# Patient Record
Sex: Male | Born: 1940 | Race: Black or African American | Hispanic: No | Marital: Married | Smoking: Former smoker
Health system: Southern US, Community
[De-identification: ages and names within clinical notes are randomized; demographics above are authoritative.]

## PROBLEM LIST (undated history)

## (undated) DIAGNOSIS — C61 Malignant neoplasm of prostate: Principal | ICD-10-CM

## (undated) DIAGNOSIS — Z923 Personal history of irradiation: Secondary | ICD-10-CM

## (undated) HISTORY — DX: Personal history of irradiation: Z92.3

## (undated) HISTORY — PX: OTHER SURGICAL HISTORY: SHX169

## (undated) SURGERY — MINOR CAPSULOTOMY
Anesthesia: Choice | Laterality: Left

---

## 1998-04-05 ENCOUNTER — Encounter: Admission: RE | Admit: 1998-04-05 | Discharge: 1998-04-05 | Payer: Self-pay | Admitting: *Deleted

## 2005-07-21 ENCOUNTER — Ambulatory Visit (HOSPITAL_COMMUNITY): Admission: RE | Admit: 2005-07-21 | Discharge: 2005-07-21 | Payer: Self-pay | Admitting: Ophthalmology

## 2008-06-26 ENCOUNTER — Ambulatory Visit (HOSPITAL_COMMUNITY): Admission: RE | Admit: 2008-06-26 | Discharge: 2008-06-26 | Payer: Self-pay | Admitting: Ophthalmology

## 2009-08-11 IMAGING — CR DG CHEST 2V
2 series · 2 of 2 positions shown · non-contrast
Comparison: [DATE] S6.

CLINICAL DATA: Preoperative study for cataract surgery.

CHEST - 2 VIEW

[view not recorded (1 of 2)]
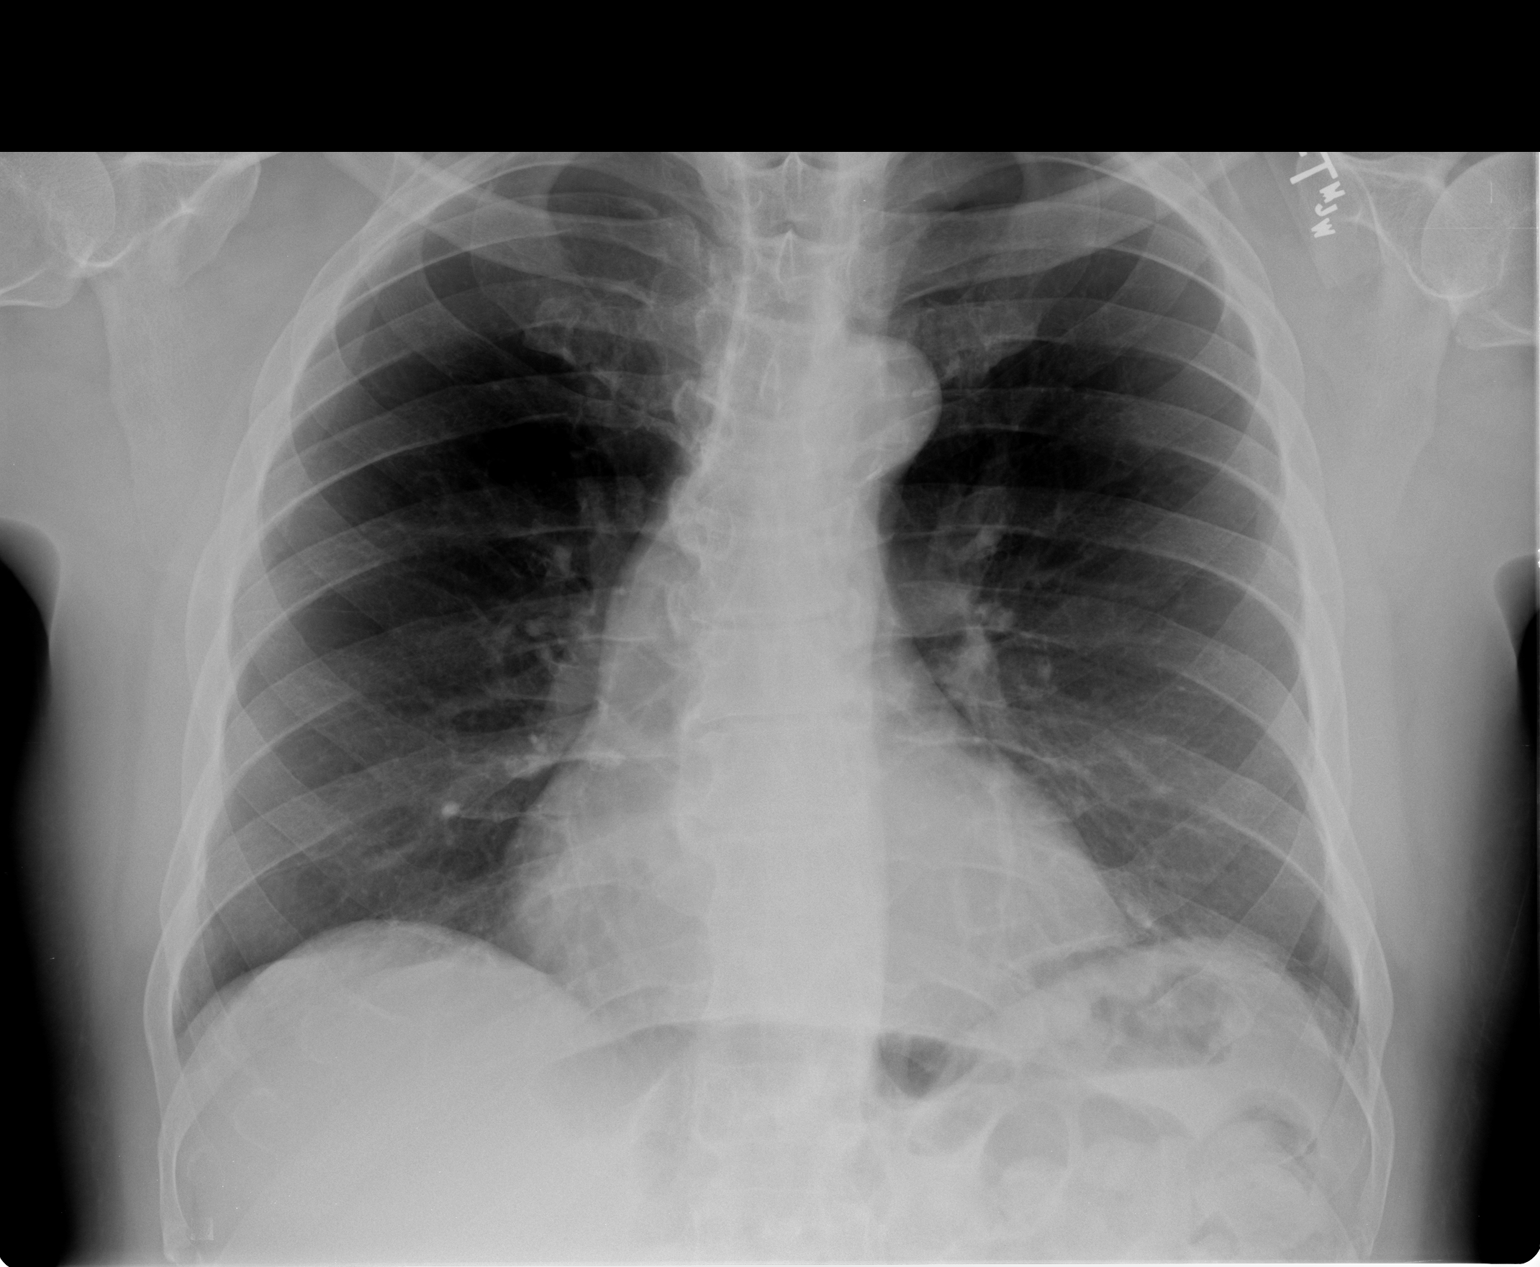

[view not recorded (2 of 2)]
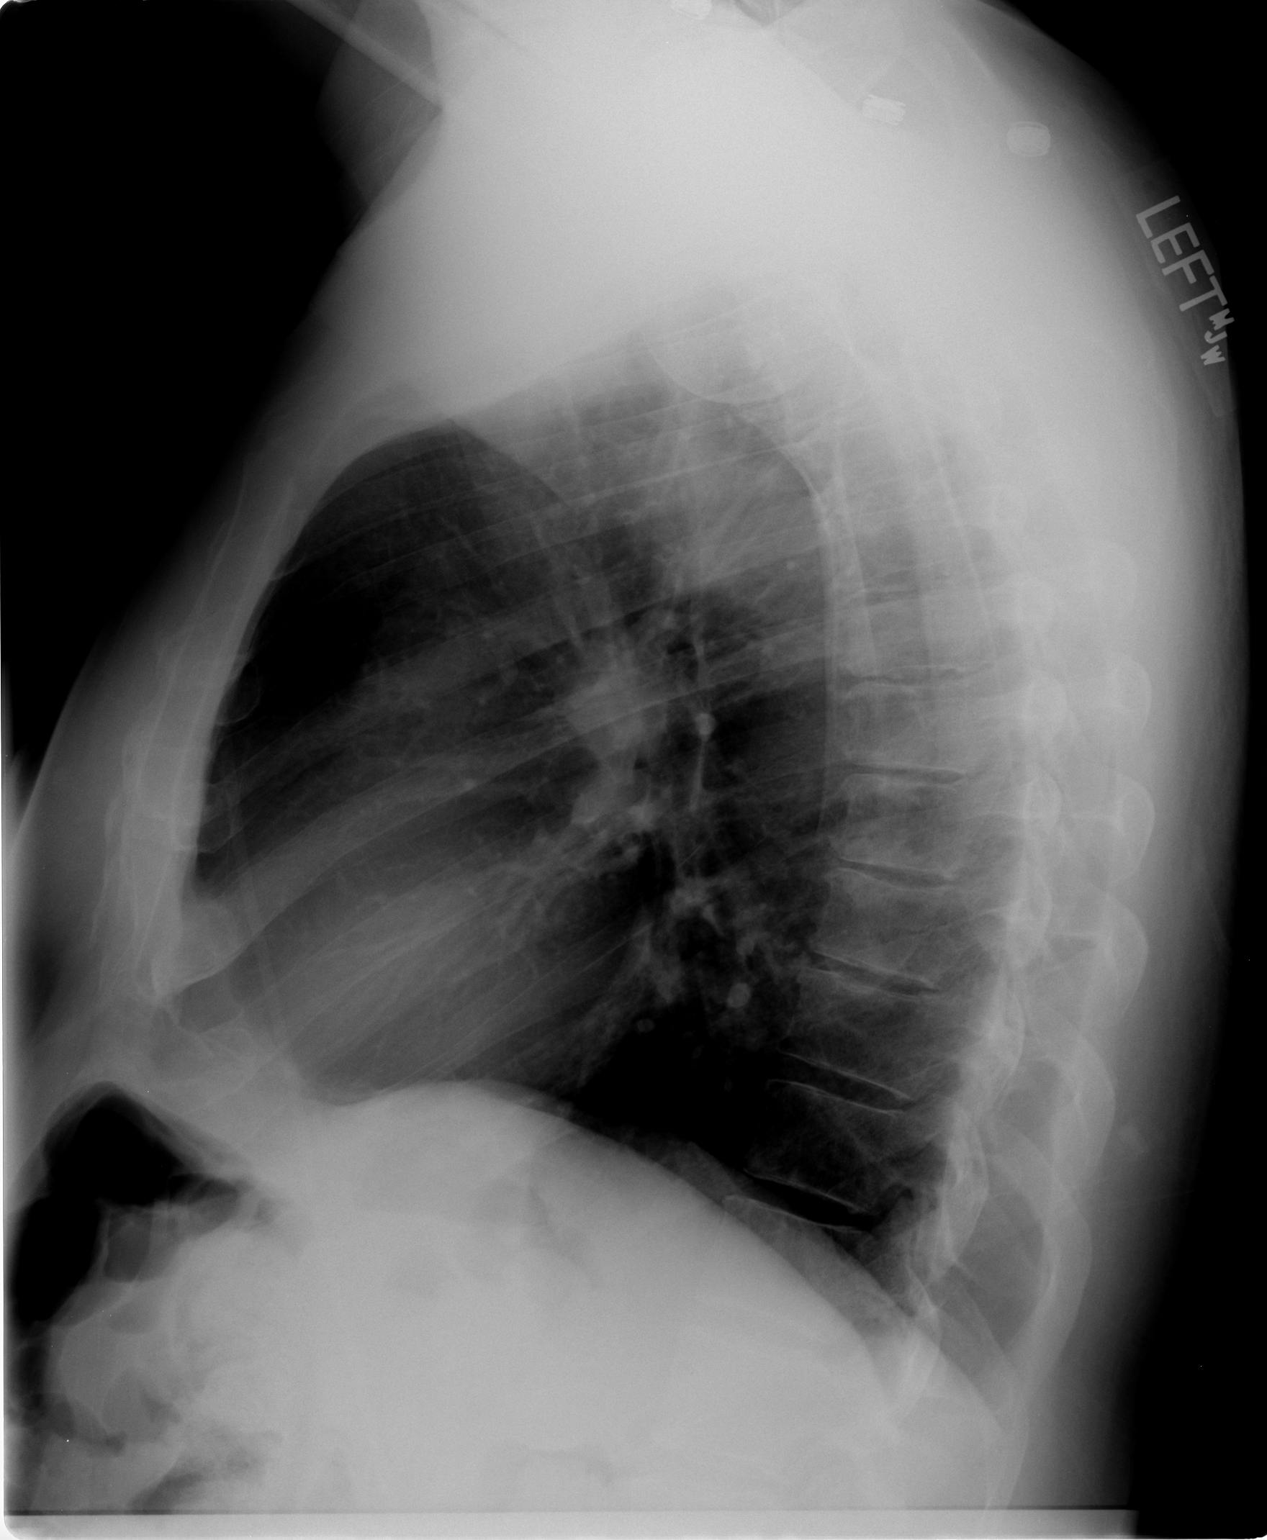

[2 of 2 positions shown; findings below may reference images not displayed]

FINDINGS: The heart size is within normal limits.  There is no
heart failure.  The lungs are clear without infiltrate or effusion.
IMPRESSION: No active cardiopulmonary disease.

## 2010-12-30 NOTE — Op Note (Signed)
Nicholas Conley, Nicholas Conley             ACCOUNT NO.:  [id number]   MEDICAL RECORD NO.:  [id number]          PATIENT TYPE:  AMB   LOCATION:  SDS                          FACILITY:  [facility]   PHYSICIAN:  Salley Scarlet., M.D.DATE OF BIRTH:  Oct 06, 1940   DATE OF PROCEDURE:  DATE OF DISCHARGE:  06/26/2008                               OPERATIVE REPORT   PREOPERATIVE DIAGNOSIS:  Immature cataract, left eye.   POSTOPERATIVE DIAGNOSIS:  Immature cataract, left eye.   OPERATION:  Kelman phacoemulsification of cataract, left eye.   ANESTHESIA:  Local, using Xylocaine 2% and Marcaine 0.75% with Wydase.   JUSTIFICATION FOR PROCEDURE:  This is a 70 year old gentleman who  underwent a cataract extraction from the right eye approximately 2 years  ago.  He obtained good visual result, but now complains of blurring of  vision with difficulty seeing to read.  He was evaluated and found to  have a visual acuity best corrected to 20/25 on the right, 20/60 on the  left.  There was an intraocular lens present and centered nicely in the  right eye, but there was a posterior 2+ nuclear sclerotic cataract along  with the posterior capsular changes of the left eye.  Cataract  extraction was recommended and he was admitted at this time for that  purpose.   PROCEDURE:  Under the influence of IV sedation, a Van Lint akinesia and  retrobulbar anesthesia was given.  The patient was prepped and draped in  usual manner.  The lid speculum was inserted under the upper and lower  lid of the left eye, and a 4-0 silk traction suture was passed through  the belly of the superior rectus muscle retraction.  A fornix-based  conjunctival flap was turned and hemostasis was achieved using cautery.  An incision was made in the sclera at the limbus.  This incision was  dissected down to clear cornea using a crescent blade.  A sideport  incision was made at 1:30 o'clock position.  OcuCoat was injected into  the eye through  the sideport incision.  The anterior chamber was then  entered through the corneoscleral tunnel incision at 11:30 o'clock  position using a 2.75 mm keratome.  An anterior capsulotomy was  performed using a bent 25-gauge needle.  The nucleus was hydrodissected  using Xylocaine.  The KPE handpiece was passed into the eye, and the  nucleus was emulsified without difficulty.  The residual cortical  material was aspirated.  The posterior capsule was polished using an  olive tip polisher.  The wound was widened slightly to accommodate  foldable silicone lens.  The lens was seated into the eye behind the  iris without difficulty.  The anterior chamber was reformed and pupil  was constricted using Miochol.  The lips of the wound hydrated and  tested to make sure that there was no leak.  After ascertaining that  there was no leak, the conjunctiva was closed over the wound using  thermal cautery.  Celestone 1 mL and 0.5 mL of gentamicin were injected  subconjunctivally.  Maxitrol ophthalmic ointment and Pilopine ointment  were applied along  with a patch and Fox shield.  The patient tolerated  the procedure well and was discharged to the post anesthesia recovery  room in  satisfactory condition.  He is instructed to rest today, to take Tylenol  every 4 hours as needed for pain, and to see me in office tomorrow for  further evaluation.   DISCHARGE DIAGNOSIS:  Immature cataract, left eye.      Salley Scarlet., M.D.  Electronically Signed     TB/MEDQ  D:  06/27/2008  T:  06/27/2008  Job:  [id number]

## 2010-12-30 NOTE — Op Note (Signed)
Nicholas Conley, Nicholas Conley             ACCOUNT NO.:  [id number]   MEDICAL RECORD NO.:  [id number]          PATIENT TYPE:  AMB   LOCATION:  SDS                          FACILITY:  [facility]   PHYSICIAN:  Salley Scarlet., M.D.DATE OF BIRTH:  02-04-1941   DATE OF PROCEDURE:  09/21/2005  DATE OF DISCHARGE:  07/21/2005                                 OPERATIVE REPORT   PREOPERATIVE DIAGNOSIS:  Mature cataract, right eye.   POSTOPERATIVE DIAGNOSIS:  Mature cataract, right eye.   OPERATION PERFORMED:  Extracapsular cataract extraction with intraocular  lens implantation.   SURGEON:  Nadyne Coombes, M.D.   ANESTHESIA:  Local using Xylocaine 2% and Marcaine 0.75% and Wydase.   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  He presented  to me for evaluation and was found to have a dense white cataract of the  right eye with visual acuity best corrected to hand motion in that eye.  Vision was corrected to 20/30 in the left eye.  Cataract extraction of the  right eye was recommended with intraocular lens implantation.  He was  admitted at this time for that purpose.   DESCRIPTION OF PROCEDURE:  Under the influence of intravenous sedation and  Van Lint akinesia, retrobulbar anesthesia was given.  The patient was  prepped and draped in the usual manner.  A lid speculum was inserted under  the upper and lower lid of the right eye and a 4-0 silk traction suture was  passed through the belly of the superior rectus muscle for traction.  A  fornix-based conjunctival flap was turned and hemostasis achieved using  cautery.  A grooved incision was made around the limbus for 120 degrees.  The anterior chamber was entered through this grooved incision at the 11:30  o'clock position.  Ocucoat was injected into the eye through this incision.  An anterior capsulotomy was done using a bent 25 gauge needle.  The  corneoscleral wound was extended first toward the left then toward the right  placing a single 8-0 Vicryl suture across each arm of the incision as it was  respectively first toward the left then toward the right.  The nucleus was  then manually expressed from the eye.  The two previously placed sutures  were closed and a third one was placed at the 12 o'clock position.  The  irrigation aspiration handpiece was passed into the eye and the residual  cortical material was aspirated.  The posterior capsule was polished with  the olive tip polisher.  An rigid PMMA posterior chamber lens was seated  into the eye behind the iris without difficulty.  The anterior chamber was  reformed and the pupil was constricted using Miochol.  The corneoscleral  wound was closed using a combination of interrupted sutures in 10-0 nylon  sutures.  After it was ascertained that the wound was airtight and  watertight, the conjunctiva was closed using thermal cautery.  1mL of  Celestone and 0.16mL of gentamicin were injected subconjunctivally.  Maxitrol  ophthalmic  ointment and Pilopine drops were applied along with a patch and  Fox shield. The patient tolerated the procedure well and was discharged to  the post anesthesia  recovery room in satisfactory condition.  The patient was instructed to rest  today, to take Vicodin every four hours as needed for pain and to see me in  the office tomorrow for further evaluation.   DISCHARGE DIAGNOSIS:  Immature cataract, right eye.      Salley Scarlet., M.D.  Electronically Signed     TB/MEDQ  D:  09/21/2005  T:  09/22/2005  Job:  [id number]

## 2011-05-16 LAB — BASIC METABOLIC PANEL
BUN: 29 — ABNORMAL HIGH
CO2: 31
Calcium: 9.9
Creatinine, Ser: 1.52 — ABNORMAL HIGH
GFR calc Af Amer: 56 — ABNORMAL LOW

## 2011-05-16 LAB — URINALYSIS, ROUTINE W REFLEX MICROSCOPIC
Glucose, UA: NEGATIVE
Ketones, ur: NEGATIVE
Nitrite: NEGATIVE
Protein, ur: NEGATIVE
Urobilinogen, UA: 0.2

## 2011-05-16 LAB — GLUCOSE, CAPILLARY: Glucose-Capillary: 56 — ABNORMAL LOW

## 2011-05-16 LAB — CBC
MCHC: 33.7
Platelets: 330
RBC: 4.15 — ABNORMAL LOW
WBC: 4.7

## 2013-07-04 DIAGNOSIS — C61 Malignant neoplasm of prostate: Secondary | ICD-10-CM

## 2013-07-04 HISTORY — DX: Malignant neoplasm of prostate: C61

## 2013-07-30 ENCOUNTER — Ambulatory Visit: Payer: Medicare Other

## 2013-07-30 ENCOUNTER — Ambulatory Visit: Payer: Medicare Other | Admitting: Radiation Oncology

## 2013-08-18 ENCOUNTER — Ambulatory Visit: Payer: Medicare Other | Admitting: Radiation Oncology

## 2013-08-18 ENCOUNTER — Ambulatory Visit: Payer: Medicare Other

## 2013-08-22 ENCOUNTER — Encounter: Payer: Self-pay | Admitting: Radiation Oncology

## 2013-08-22 NOTE — Progress Notes (Signed)
GU Location of Tumor / Histology: prostate  If Prostate Cancer, 07/07/13 Biopsy= Adenocarcinaom Gleason Score is (4 + 3=7 and (3+4=7))  And (3+3=6)and PSA is 9.75 on 05/21/13, 8.59 on 07/30/12 prostate volume=71cc  Patient presented  months ago with signs/symptoms of: elevated PSA's, frequency,urgency and occasional incontinence  Biopsies of prostate (if applicable) revealed:  2/26 cores adenocarcinoma  Past/Anticipated interventions by urology, if any: last  appt with  Dr.Nesi,Marc12/12/14,   Past/Anticipated interventions by medical oncology, if any : No  Weight changes, if any: no  Bowel/Bladder complaints, if any: BPH/LUTS  Nausea/Vomiting, if any: no  Pain issues, if any:  no  SAFETY ISSUES:  Prior radiation? No  Pacemaker/ICD? NO  Is the patient on methotrexate? No  Current Complaints / other details: Physician,  Married , 2 children,fathere deceased pneumonia, mother deceased age 31 stroke, smoked 1/2ppd 20 years ,quit 25 years ago

## 2013-08-28 ENCOUNTER — Encounter: Payer: Self-pay | Admitting: Radiation Oncology

## 2013-08-28 ENCOUNTER — Ambulatory Visit
Admission: RE | Admit: 2013-08-28 | Discharge: 2013-08-28 | Disposition: A | Discharge status: Home / Self Care | Payer: Medicare Other | Source: Ambulatory Visit | Attending: Radiation Oncology | Admitting: Radiation Oncology

## 2013-08-28 VITALS — BP 131/78 | HR 101 | Temp 97.8°F | Resp 20 | Ht 74.0 in | Wt 200.1 lb

## 2013-08-28 DIAGNOSIS — Z7982 Long term (current) use of aspirin: Secondary | ICD-10-CM | POA: Insufficient documentation

## 2013-08-28 DIAGNOSIS — N529 Male erectile dysfunction, unspecified: Secondary | ICD-10-CM | POA: Insufficient documentation

## 2013-08-28 DIAGNOSIS — Z79899 Other long term (current) drug therapy: Secondary | ICD-10-CM | POA: Insufficient documentation

## 2013-08-28 DIAGNOSIS — N400 Enlarged prostate without lower urinary tract symptoms: Secondary | ICD-10-CM | POA: Insufficient documentation

## 2013-08-28 DIAGNOSIS — C61 Malignant neoplasm of prostate: Secondary | ICD-10-CM | POA: Insufficient documentation

## 2013-08-28 DIAGNOSIS — Z87891 Personal history of nicotine dependence: Secondary | ICD-10-CM | POA: Insufficient documentation

## 2013-08-28 HISTORY — DX: Malignant neoplasm of prostate: C61

## 2013-08-28 NOTE — Progress Notes (Signed)
[facility] NEW PATIENT EVALUATION  Name: Nicholas Conley MRN: [id number]  Date:   08/28/2013           DOB: 1941-03-25  Status: outpatient   CC: No primary provider on file.  Lowella Bandy, MD    REFERRING PHYSICIAN: Lowella Bandy, MD   DIAGNOSIS: Stage TI C. intermediate risk adenocarcinoma prostate   HISTORY OF PRESENT ILLNESS:  Nicholas Conley is a 73 y.o. male who is seen today through the courtesy Dr. Janice Norrie for discussion of possible radiation therapy in the management of his stage TI C. intermediate risk adenocarcinoma prostate. His PSA was 6.2 in May 2013, and rose to 8.0 by late 2013. A followup PSA on 07/30/2012 was 8.59 with a percentage free of 30. Followup PSA on 05/21/2013 was 9.75 with a percentage free of 22%. He underwent ultrasound-guided biopsies on 07/04/2013 and was found to have Gleason 7 (3+4) involving 20% of one core from left base, 40% of one core from the left lateral mid gland and also Gleason 7 (4+3) involving 30% of one core from the left mid gland. He was found to have Gleason 6 (3+3) involving less than 5% of one core from right lateral apex, 5% of one core from the left lateral base, and 20% of one core from the left apex. 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 His prostate volume was 71 cc. He comes in today interested in seed implantation. He is doing  well from a GU and GI standpoint. His I PSS score is 8. He does have erectile dysfunction.  PREVIOUS RADIATION THERAPY: No   PAST MEDICAL HISTORY:  has a past medical history of Prostate cancer (07/04/13).     PAST SURGICAL HISTORY:  Past Surgical History  Procedure Laterality Date  . Cataract surgery      bilaterally     FAMILY HISTORY: His father died of pneumonia 33, and his mother died following a stroke at 62. No family history of prostate cancer. Married, 2 children. 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  SOCIAL  HISTORY:  reports that he has quit smoking. His smoking use included Cigarettes. He has a 10 pack-year smoking history. He has never used smokeless tobacco.   ALLERGIES: Review of patient's allergies indicates not on file.   MEDICATIONS:  Current Outpatient Prescriptions  Medication Sig Dispense Refill  . aspirin 81 MG tablet Take 81 mg by mouth daily.      Marland Kitchen Dextromethorphan-Guaifenesin (CORICIDIN HBP CONGESTION/COUGH PO) Take 1 tablet by mouth 2 (two) times daily.      Marland Kitchen glyBURIDE micronized (GLYNASE) 6 MG tablet Take 6 mg by mouth 2 (two) times daily before a meal.      . Menthol (COUGH DROPS) 5 MG LOZG Use as directed 1 lozenge in the mouth or throat daily as needed. Usually takes throughout the day to suppres cough      . metFORMIN (GLUCOPHAGE) 1000 MG tablet Take 1,000 mg by mouth 2 (two) times daily with a meal.      . quinapril (ACCUPRIL) 40 MG tablet Take 40 mg by mouth.      . simvastatin (ZOCOR) 40 MG tablet Take 40 mg by mouth daily.      . tadalafil (CIALIS) 5 MG tablet Take 5 mg by mouth daily as needed for erectile dysfunction.       No current facility-administered medications for this encounter.     REVIEW  OF SYSTEMS:  Pertinent items are noted in HPI.    PHYSICAL EXAM:  height is 6\' 2"  (1.88 m) and weight is 200 lb 1.6 oz (90.765 kg). His oral temperature is 97.8 F (36.6 C). His blood pressure is 131/78 and his pulse is 101. His respiration is 20.   Alert and oriented 73 year old African American male appearing younger than stated age. Head and neck examination: Grossly unremarkable. Nodes: Without palpable cervical or supraclavicular adenopathy. Chest: Lungs clear. Abdomen: Without masses organomegaly. Genitalia: Unremarkable to inspection. Rectal: His prostate gland is moderately enlarged and is without focal induration or nodularity. Extremities: Without edema.   LABORATORY DATA:  Lab Results  Component Value Date   WBC 4.7 06/26/2008   HGB 12.4* 06/26/2008    HCT 36.8* 06/26/2008   MCV 88.7 06/26/2008   PLT 330 06/26/2008   Lab Results  Component Value Date   NA 143 06/26/2008   K 4.4 06/26/2008   CL 105 06/26/2008   CO2 31 06/26/2008   No results found for this basename: ALT, AST, GGT, ALKPHOS, BILITOT   PSA 9.75 from 05/21/2013   IMPRESSION: Stage TI C. intermediate risk adenocarcinoma prostate. I explained to Dr. [name] that his prognosis is related to his stage, Gleason score, and PSA level. His stage and PSA level are favorable while his Gleason score of 7 is of intermediate favorability. Other prognostic factors include PSA doubling time and disease volume. His disease volume his intermediate. We discussed surgery versus observation/surveillance versus radiation therapy. Radiation therapy options include seed implantation with or without 5 weeks of external beam radiation therapy or 8 weeks of external beam/IMRT. Considering his disease volume and have a Gleason 7 (4+3) I do not recommend seed implantation alone. He would require 5 weeks of 3-D conformal radiation therapy in addition to seed implantation. It is possible that he may need to be downsized for 3 months to be a candidate for seed implantation. We also discussed short-term androgen deprivation therapy considering his intermediate risk disease, and this is currently being studied in a national trial. After lengthy discussion he is now leaning towards external beam/IMRT. We discussed the potential acute and late toxicities of radiation therapy. I told that this could be given at [facility], closer to home with Dr. Charlotta Newton, an experienced prostate radiation oncologist. He tells me he prefers to have his treatment geographically away from his work, and would like to have treatment here at [facility] in [place]. Consent is signed today. I will ask Dr. Janice Norrie to placed 3 gold seed markers within the prostate for image guidance during his IMRT. We'll then get him scheduled for  simulation/treatment planning.   PLAN: As discussed above.  I spent 60 minutes minutes face to face with the patient and more than 50% of that time was spent in counseling and/or coordination of care.

## 2013-08-29 ENCOUNTER — Telehealth: Payer: Self-pay | Admitting: *Deleted

## 2013-08-29 NOTE — Telephone Encounter (Signed)
CALLED PATIENT TO INFORM OF GOLD SEED PLACEMENT ON 10-01-13 - ARRIVAL TIME - 8:45 AM AT DR. Janice Norrie' AND HIS SIM ON 09-30-13 AT 9:00 AM AT DR. MURRAY'S OFFICE, LVM FOR A RETURN CALL

## 2013-09-02 ENCOUNTER — Telehealth: Payer: Self-pay | Admitting: *Deleted

## 2013-09-02 NOTE — Telephone Encounter (Signed)
Called patient to inform of gold seed placement on 09-25-13 at the urologist's office and his sim on 09-30-13 at 9:00 am at Dr. Charlton Amor Office, spoke with patient and he is aware of these appts.

## 2013-09-29 ENCOUNTER — Telehealth: Payer: Self-pay | Admitting: *Deleted

## 2013-09-29 NOTE — Telephone Encounter (Signed)
Patient's seeds was placed on 09-25-13 @ Dr. Janice Norrie' Office, confirmed this info. With [facility].

## 2013-09-30 ENCOUNTER — Ambulatory Visit
Admission: RE | Admit: 2013-09-30 | Discharge: 2013-09-30 | Disposition: A | Discharge status: Home / Self Care | Payer: Medicare Other | Source: Ambulatory Visit | Attending: Radiation Oncology | Admitting: Radiation Oncology

## 2013-09-30 DIAGNOSIS — R3915 Urgency of urination: Secondary | ICD-10-CM | POA: Insufficient documentation

## 2013-09-30 DIAGNOSIS — C61 Malignant neoplasm of prostate: Secondary | ICD-10-CM | POA: Insufficient documentation

## 2013-09-30 DIAGNOSIS — R32 Unspecified urinary incontinence: Secondary | ICD-10-CM | POA: Insufficient documentation

## 2013-09-30 DIAGNOSIS — Z510 Encounter for antineoplastic radiation therapy: Secondary | ICD-10-CM | POA: Insufficient documentation

## 2013-09-30 NOTE — Progress Notes (Signed)
Complex simulation/treatment planning note: The patient was taken to the CT simulator. He was placed supine. A custom vac lock immobilization device was constructed. A rubber tube was placed within the rectal vault. He was then catheterized and contrast instilled into the bladder/urethra. The CT data set was sent to the treatment planning system for contouring of his prostate, seminal vesicles, rectum, bladder, and rectosigmoid colon. I'm prescribing 7800 cGy to his prostate PTV which represents the prostate was 0.8 cm except for 0.5 cm along the rectum. I prescribing 5600 cGy in 40 sessions to his seminal vesicle PTV which represents the seminal vesicles posterior 0.5 cm. He'll be treated with a comfortably full bladder and he'll undergo daily cone beam CT image guidance setting up to his 3 gold seeds. He is now ready for IMRT simulation/treatment planning.

## 2013-10-02 ENCOUNTER — Encounter: Payer: Self-pay | Admitting: Radiation Oncology

## 2013-10-02 NOTE — Progress Notes (Signed)
IMRT simulation/treatment planning note: The patient completed his IMRT simulation/treatment planning in the management of his carcinoma the prostate. IMRT was chosen to decrease the risk for both acute and late bladder and rectal toxicity compared to conventional or 3-D conformal radiation therapy. Dose volume histograms were obtained for the target structures including the prostate and seminal vesicles and also avoidance structures including the bladder, rectum, and femoral heads. We met our departmental goals. He is being treated with 2 arcs VMAT was 6 MV photons. I prescribing 7800 cGy to the prostate PTV and 5600 cGy to his seminal vesicle PTV in 40 sessions. I requesting daily image guidance with cone beam CT setting up to his 3 gold seeds. I am also requesting that he be treated with a company full bladder.

## 2013-10-09 ENCOUNTER — Encounter: Payer: Self-pay | Admitting: Radiation Oncology

## 2013-10-09 ENCOUNTER — Ambulatory Visit
Admission: RE | Admit: 2013-10-09 | Discharge: 2013-10-09 | Disposition: A | Discharge status: Home / Self Care | Payer: Medicare Other | Source: Ambulatory Visit | Attending: Radiation Oncology | Admitting: Radiation Oncology

## 2013-10-09 DIAGNOSIS — C61 Malignant neoplasm of prostate: Secondary | ICD-10-CM

## 2013-10-09 NOTE — Progress Notes (Signed)
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 He was treated with dual ARC VMAT with dynamic MLCs corresponding to one set of IMRT treatment devices [phone])

## 2013-10-09 NOTE — Progress Notes (Signed)
Weekly Management Note:  Site: Prostate Current Dose:  195  cGy Projected Dose: 7800  cGy  Narrative: The patient is seen today for routine under treatment assessment. CBCT/MVCT images/port films were reviewed. The chart was reviewed.   Bladder filling satisfactory. No new GU or GI difficulties.  Physical Examination: There were no vitals filed for this visit..  Weight:  . No change.  Impression: Tolerating radiation therapy well.  Plan: Continue radiation therapy as planned.

## 2013-10-09 NOTE — Progress Notes (Signed)
Post sim ed completed w/pt. Gave pt "Radiation and You" booklet w/all pertinent information marked and discussed, re: fatigue, rectal discomfort/care, urinary issues/management, nutrition, pain. Pt verbalized understanding. Pt had questions re: intercourse/condom use, weekly labs. Dr Valere Dross in with pt to answer questions.

## 2013-10-10 ENCOUNTER — Ambulatory Visit
Admission: RE | Admit: 2013-10-10 | Discharge: 2013-10-10 | Disposition: A | Discharge status: Home / Self Care | Payer: Medicare Other | Source: Ambulatory Visit | Attending: Radiation Oncology | Admitting: Radiation Oncology

## 2013-10-13 ENCOUNTER — Ambulatory Visit
Admission: RE | Admit: 2013-10-13 | Discharge: 2013-10-13 | Disposition: A | Discharge status: Home / Self Care | Payer: Medicare Other | Source: Ambulatory Visit | Attending: Radiation Oncology | Admitting: Radiation Oncology

## 2013-10-13 ENCOUNTER — Encounter: Payer: Self-pay | Admitting: Radiation Oncology

## 2013-10-13 VITALS — BP 153/79 | HR 102 | Temp 98.3°F | Resp 20 | Wt 198.8 lb

## 2013-10-13 DIAGNOSIS — C61 Malignant neoplasm of prostate: Secondary | ICD-10-CM

## 2013-10-13 NOTE — Progress Notes (Signed)
Pt denies pain, urinary/bowel issues, fatigue, loss of appetite. 

## 2013-10-13 NOTE — Progress Notes (Signed)
Weekly Management Note:  Site: Prostate Current Dose:  585  cGy Projected Dose: 7800  cGy  Narrative: The patient is seen today for routine under treatment assessment. CBCT/MVCT images/port films were reviewed. The chart was reviewed.   Bladder filling is satisfactory, but could improve. No GU or GI difficulty. Physical Examination:  Filed Vitals:   10/13/13 0841  BP: 153/79  Pulse: 102  Temp: 98.3 F (36.8 C)  Resp: 20  .  Weight: 198 lb 12.8 oz (90.175 kg). No change.  Impression: Tolerating radiation therapy well.  Plan: Continue radiation therapy as planned.

## 2013-10-14 ENCOUNTER — Ambulatory Visit
Admission: RE | Admit: 2013-10-14 | Discharge: 2013-10-14 | Disposition: A | Discharge status: Home / Self Care | Payer: Medicare Other | Source: Ambulatory Visit | Attending: Radiation Oncology | Admitting: Radiation Oncology

## 2013-10-15 ENCOUNTER — Ambulatory Visit
Admission: RE | Admit: 2013-10-15 | Discharge: 2013-10-15 | Disposition: A | Discharge status: Home / Self Care | Payer: Medicare Other | Source: Ambulatory Visit | Attending: Radiation Oncology | Admitting: Radiation Oncology

## 2013-10-16 ENCOUNTER — Ambulatory Visit
Admission: RE | Admit: 2013-10-16 | Discharge: 2013-10-16 | Disposition: A | Discharge status: Home / Self Care | Payer: Medicare Other | Source: Ambulatory Visit | Attending: Radiation Oncology | Admitting: Radiation Oncology

## 2013-10-17 ENCOUNTER — Ambulatory Visit
Admission: RE | Admit: 2013-10-17 | Discharge: 2013-10-17 | Disposition: A | Discharge status: Home / Self Care | Payer: Medicare Other | Source: Ambulatory Visit | Attending: Radiation Oncology | Admitting: Radiation Oncology

## 2013-10-20 ENCOUNTER — Ambulatory Visit
Admission: RE | Admit: 2013-10-20 | Discharge: 2013-10-20 | Disposition: A | Discharge status: Home / Self Care | Payer: Medicare Other | Source: Ambulatory Visit | Attending: Radiation Oncology | Admitting: Radiation Oncology

## 2013-10-20 ENCOUNTER — Encounter: Payer: Self-pay | Admitting: Radiation Oncology

## 2013-10-20 VITALS — BP 132/76 | HR 90 | Temp 97.9°F | Resp 20 | Wt 204.4 lb

## 2013-10-20 DIAGNOSIS — C61 Malignant neoplasm of prostate: Secondary | ICD-10-CM

## 2013-10-20 NOTE — Progress Notes (Signed)
Weekly Management Note:  Site: Prostate Current Dose:  1560  cGy Projected Dose: 7800  cGy  Narrative: The patient is seen today for routine under treatment assessment. CBCT/MVCT images/port films were reviewed. The chart was reviewed.   Bladder filling is satisfactory. No GU or GI difficulty.  Physical Examination:  Filed Vitals:   10/20/13 0836  BP: 132/76  Pulse: 90  Temp: 97.9 F (36.6 C)  Resp: 20  .  Weight: 204 lb 6.4 oz (92.715 kg). No change .  Impression: Tolerating radiation therapy well.  Plan: Continue radiation therapy as planned.

## 2013-10-20 NOTE — Progress Notes (Signed)
Pt denies urinary/bowel issues, fatigue, loss of appetite.

## 2013-10-21 ENCOUNTER — Ambulatory Visit
Admission: RE | Admit: 2013-10-21 | Discharge: 2013-10-21 | Disposition: A | Discharge status: Home / Self Care | Payer: Medicare Other | Source: Ambulatory Visit | Attending: Radiation Oncology | Admitting: Radiation Oncology

## 2013-10-22 ENCOUNTER — Ambulatory Visit
Admission: RE | Admit: 2013-10-22 | Discharge: 2013-10-22 | Disposition: A | Discharge status: Home / Self Care | Payer: Medicare Other | Source: Ambulatory Visit | Attending: Radiation Oncology | Admitting: Radiation Oncology

## 2013-10-23 ENCOUNTER — Ambulatory Visit
Admission: RE | Admit: 2013-10-23 | Discharge: 2013-10-23 | Disposition: A | Discharge status: Home / Self Care | Payer: Medicare Other | Source: Ambulatory Visit | Attending: Radiation Oncology | Admitting: Radiation Oncology

## 2013-10-24 ENCOUNTER — Ambulatory Visit
Admission: RE | Admit: 2013-10-24 | Discharge: 2013-10-24 | Disposition: A | Discharge status: Home / Self Care | Payer: Medicare Other | Source: Ambulatory Visit | Attending: Radiation Oncology | Admitting: Radiation Oncology

## 2013-10-27 ENCOUNTER — Ambulatory Visit
Admission: RE | Admit: 2013-10-27 | Discharge: 2013-10-27 | Disposition: A | Discharge status: Home / Self Care | Payer: Medicare Other | Source: Ambulatory Visit | Attending: Radiation Oncology | Admitting: Radiation Oncology

## 2013-10-27 ENCOUNTER — Encounter: Payer: Self-pay | Admitting: Radiation Oncology

## 2013-10-27 VITALS — BP 143/77 | HR 87 | Temp 97.8°F | Resp 20 | Wt 198.0 lb

## 2013-10-27 DIAGNOSIS — C61 Malignant neoplasm of prostate: Secondary | ICD-10-CM

## 2013-10-27 NOTE — Progress Notes (Signed)
Weekly Management Note:  Site: Prostate Current Dose:  2535  cGy Projected Dose: 7800  cGy  Narrative: The patient is seen today for routine under treatment assessment. CBCT/MVCT images/port films were reviewed. The chart was reviewed.   Bladder filling is satisfactory. He is having more urinary urgency resulting in incontinence over the weekend. Denies dysuria. No GI difficulties.  Physical Examination:  Filed Vitals:   10/27/13 0853  BP: 143/77  Pulse: 87  Temp: 97.8 F (36.6 C)  Resp: 20  .  Weight: 198 lb (89.812 kg). No change.  Impression: Tolerating radiation therapy well. 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 I told Dr. 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  Plan: Continue radiation therapy as planned.

## 2013-10-27 NOTE — Progress Notes (Signed)
Pt states he developed urinary urgency and some incontinence over the weekend. He states that he feels he may have a UTI. He denies dysuria, other urinary issues, bowel issues, fatigue, loss of appetite. Pt will discuss having UA today with Dr Valere Dross.

## 2013-10-28 ENCOUNTER — Ambulatory Visit
Admission: RE | Admit: 2013-10-28 | Discharge: 2013-10-28 | Disposition: A | Discharge status: Home / Self Care | Payer: Medicare Other | Source: Ambulatory Visit | Attending: Radiation Oncology | Admitting: Radiation Oncology

## 2013-10-29 ENCOUNTER — Ambulatory Visit
Admission: RE | Admit: 2013-10-29 | Discharge: 2013-10-29 | Disposition: A | Discharge status: Home / Self Care | Payer: Medicare Other | Source: Ambulatory Visit | Attending: Radiation Oncology | Admitting: Radiation Oncology

## 2013-10-30 ENCOUNTER — Ambulatory Visit
Admission: RE | Admit: 2013-10-30 | Discharge: 2013-10-30 | Disposition: A | Discharge status: Home / Self Care | Payer: Medicare Other | Source: Ambulatory Visit | Attending: Radiation Oncology | Admitting: Radiation Oncology

## 2013-10-31 ENCOUNTER — Ambulatory Visit
Admission: RE | Admit: 2013-10-31 | Discharge: 2013-10-31 | Disposition: A | Discharge status: Home / Self Care | Payer: Medicare Other | Source: Ambulatory Visit | Attending: Radiation Oncology | Admitting: Radiation Oncology

## 2013-11-03 ENCOUNTER — Encounter: Payer: Self-pay | Admitting: Radiation Oncology

## 2013-11-03 ENCOUNTER — Ambulatory Visit
Admission: RE | Admit: 2013-11-03 | Discharge: 2013-11-03 | Disposition: A | Discharge status: Home / Self Care | Payer: Medicare Other | Source: Ambulatory Visit | Attending: Radiation Oncology | Admitting: Radiation Oncology

## 2013-11-03 VITALS — BP 159/80 | HR 100 | Temp 97.6°F | Resp 20 | Wt 201.3 lb

## 2013-11-03 DIAGNOSIS — C61 Malignant neoplasm of prostate: Secondary | ICD-10-CM

## 2013-11-03 NOTE — Progress Notes (Signed)
Weekly Management Note:  Site: Prostate Current Dose:  3510  cGy Projected Dose: 7800  cGy  Narrative: The patient is seen today for routine under treatment assessment. CBCT/MVCT images/port films were reviewed. The chart was reviewed.   Cone beam CT pending. He is being seen before his treatment today. He does have mild urinary urgency but is otherwise doing well from a GU and GI standpoint. His urinalysis from outside was within normal limits. He tells me he may take Pyridium for his urgency.  Physical Examination:  Filed Vitals:   11/03/13 0834  BP: 159/80  Pulse: 100  Temp: 97.6 F (36.4 C)  Resp: 20  .  Weight: 201 lb 4.8 oz (91.309 kg). No change.  Impression: Tolerating radiation therapy well.  Plan: Continue radiation therapy as planned.

## 2013-11-03 NOTE — Progress Notes (Signed)
Pt denies pain, bowel issues, fatigue, loss of appetite. He continues to have urinary urgency. He states he had UA, C&S done and results were normal.

## 2013-11-04 ENCOUNTER — Ambulatory Visit
Admission: RE | Admit: 2013-11-04 | Discharge: 2013-11-04 | Disposition: A | Discharge status: Home / Self Care | Payer: Medicare Other | Source: Ambulatory Visit | Attending: Radiation Oncology | Admitting: Radiation Oncology

## 2013-11-05 ENCOUNTER — Ambulatory Visit
Admission: RE | Admit: 2013-11-05 | Discharge: 2013-11-05 | Disposition: A | Discharge status: Home / Self Care | Payer: Medicare Other | Source: Ambulatory Visit | Attending: Radiation Oncology | Admitting: Radiation Oncology

## 2013-11-06 ENCOUNTER — Ambulatory Visit
Admission: RE | Admit: 2013-11-06 | Discharge: 2013-11-06 | Disposition: A | Discharge status: Home / Self Care | Payer: Medicare Other | Source: Ambulatory Visit | Attending: Radiation Oncology | Admitting: Radiation Oncology

## 2013-11-07 ENCOUNTER — Ambulatory Visit
Admission: RE | Admit: 2013-11-07 | Discharge: 2013-11-07 | Disposition: A | Discharge status: Home / Self Care | Payer: Medicare Other | Source: Ambulatory Visit | Attending: Radiation Oncology | Admitting: Radiation Oncology

## 2013-11-10 ENCOUNTER — Ambulatory Visit: Payer: Medicare Other | Admitting: Radiation Oncology

## 2013-11-10 ENCOUNTER — Ambulatory Visit
Admission: RE | Admit: 2013-11-10 | Discharge: 2013-11-10 | Disposition: A | Discharge status: Home / Self Care | Payer: Medicare Other | Source: Ambulatory Visit | Attending: Radiation Oncology | Admitting: Radiation Oncology

## 2013-11-11 ENCOUNTER — Ambulatory Visit
Admission: RE | Admit: 2013-11-11 | Discharge: 2013-11-11 | Disposition: A | Discharge status: Home / Self Care | Payer: Medicare Other | Source: Ambulatory Visit | Attending: Radiation Oncology | Admitting: Radiation Oncology

## 2013-11-11 ENCOUNTER — Encounter: Payer: Self-pay | Admitting: Radiation Oncology

## 2013-11-11 VITALS — BP 148/74 | HR 90 | Temp 98.2°F | Resp 20 | Wt 201.4 lb

## 2013-11-11 DIAGNOSIS — C61 Malignant neoplasm of prostate: Secondary | ICD-10-CM

## 2013-11-11 MED ORDER — TAMSULOSIN HCL 0.4 MG PO CAPS: 0.4000 mg | ORAL_CAPSULE | Freq: Every day | ORAL | Status: DC

## 2013-11-11 NOTE — Progress Notes (Signed)
   Weekly Management Note:  outpatient Current Dose:  46.8 Gy  Projected Dose: 78 Gy   Narrative:  The patient presents for routine under treatment assessment.  CBCT/MVCT images/Port film x-rays were reviewed.  The chart was checked. He denies pain, loss of appetite, bowel issues. He has occasional fatigue. He states he continues to have urinary urgency with occasional incontinence. He states he has not take Pyridium due to it causing "color change of his urine"and stains his underwear. Pt inquiring about taking Detrol.   Physical Findings:  weight is 201 lb 6.4 oz (91.354 kg). His oral temperature is 98.2 F (36.8 C). His blood pressure is 148/74 and his pulse is 90. His respiration is 20.  NAD well appearing  Impression:  The patient is tolerating radiotherapy.  Plan:  Continue radiotherapy as planned.  Flomax 0.4mg  tab daily Rx'd - he can reassess with Dr. Valere Dross next week to see if this addresses his urinary symptoms satisfactorily.   He is satisfied with this plan. ________________________________   Eppie Gibson, M.D.

## 2013-11-11 NOTE — Progress Notes (Signed)
Pt denies pain, loss of appetite, bowel issues. He has occasional fatigue. He states he continues to have urinary urgency, occasional incontinence. He states he has not take Pyridium due to it causing "color change of his urine". Pt inquiring about taking Detrol, will discuss w/Dr Isidore Moos.

## 2013-11-12 ENCOUNTER — Ambulatory Visit
Admission: RE | Admit: 2013-11-12 | Discharge: 2013-11-12 | Disposition: A | Discharge status: Home / Self Care | Payer: Medicare Other | Source: Ambulatory Visit | Attending: Radiation Oncology | Admitting: Radiation Oncology

## 2013-11-13 ENCOUNTER — Ambulatory Visit
Admission: RE | Admit: 2013-11-13 | Discharge: 2013-11-13 | Disposition: A | Discharge status: Home / Self Care | Payer: Medicare Other | Source: Ambulatory Visit | Attending: Radiation Oncology | Admitting: Radiation Oncology

## 2013-11-14 ENCOUNTER — Ambulatory Visit
Admission: RE | Admit: 2013-11-14 | Discharge: 2013-11-14 | Disposition: A | Discharge status: Home / Self Care | Payer: Medicare Other | Source: Ambulatory Visit | Attending: Radiation Oncology | Admitting: Radiation Oncology

## 2013-11-17 ENCOUNTER — Ambulatory Visit
Admission: RE | Admit: 2013-11-17 | Discharge: 2013-11-17 | Disposition: A | Discharge status: Home / Self Care | Payer: Medicare Other | Source: Ambulatory Visit | Attending: Radiation Oncology | Admitting: Radiation Oncology

## 2013-11-17 ENCOUNTER — Encounter: Payer: Self-pay | Admitting: Radiation Oncology

## 2013-11-17 VITALS — BP 139/83 | HR 108 | Temp 97.6°F | Resp 20 | Wt 201.1 lb

## 2013-11-17 DIAGNOSIS — C61 Malignant neoplasm of prostate: Secondary | ICD-10-CM

## 2013-11-17 NOTE — Progress Notes (Signed)
Weekly Management Note:  Site: Prostate Current Dose:  5460  cGy Projected Dose: 7800  cGy  Narrative: The patient is seen today for routine under treatment assessment. CBCT/MVCT images/port films were reviewed. The chart was reviewed.   Bladder filling is suboptimal today. He was started on Flomax last week. He is pleased with his urination. No GU or GI difficulties.  Physical Examination:  Filed Vitals:   11/17/13 0831  BP: 139/83  Pulse: 108  Temp: 97.6 F (36.4 C)  Resp: 20  .  Weight: 201 lb 1.6 oz (91.218 kg). No change.  Impression: Tolerating radiation therapy well. I suspect that Flomax allow him to completely empty his bladder, and thus he has less than ideal bladder filling. I encouraged him to improve his bladder filling.  Plan: Continue radiation therapy as planned.

## 2013-11-17 NOTE — Progress Notes (Signed)
Pt states Flomax has greatly improved his urinary frequency and urgency. He is fatigued by end of week, no loss of appetite, no bowel issues reported.

## 2013-11-18 ENCOUNTER — Ambulatory Visit
Admission: RE | Admit: 2013-11-18 | Discharge: 2013-11-18 | Disposition: A | Discharge status: Home / Self Care | Payer: Medicare Other | Source: Ambulatory Visit | Attending: Radiation Oncology | Admitting: Radiation Oncology

## 2013-11-19 ENCOUNTER — Ambulatory Visit
Admission: RE | Admit: 2013-11-19 | Discharge: 2013-11-19 | Disposition: A | Discharge status: Home / Self Care | Payer: Medicare Other | Source: Ambulatory Visit | Attending: Radiation Oncology | Admitting: Radiation Oncology

## 2013-11-20 ENCOUNTER — Ambulatory Visit
Admission: RE | Admit: 2013-11-20 | Discharge: 2013-11-20 | Disposition: A | Discharge status: Home / Self Care | Payer: Medicare Other | Source: Ambulatory Visit | Attending: Radiation Oncology | Admitting: Radiation Oncology

## 2013-11-21 ENCOUNTER — Ambulatory Visit: Payer: Medicare Other

## 2013-11-24 ENCOUNTER — Encounter: Payer: Self-pay | Admitting: Radiation Oncology

## 2013-11-24 ENCOUNTER — Ambulatory Visit
Admission: RE | Admit: 2013-11-24 | Discharge: 2013-11-24 | Disposition: A | Discharge status: Home / Self Care | Payer: Medicare Other | Source: Ambulatory Visit | Attending: Radiation Oncology | Admitting: Radiation Oncology

## 2013-11-24 VITALS — BP 130/77 | HR 90 | Temp 97.7°F | Resp 20 | Wt 204.6 lb

## 2013-11-24 DIAGNOSIS — C61 Malignant neoplasm of prostate: Secondary | ICD-10-CM

## 2013-11-24 NOTE — Progress Notes (Signed)
Weekly Management Note:  Site: Prostate Current Dose:  6240  cGy Projected Dose: 7800  cGy  Narrative: The patient is seen today for routine under treatment assessment. CBCT/MVCT images/port films were reviewed. The chart was reviewed.   Bladder filling is satisfactory. Urine flow much improved. Flomax is quite helpful.  Physical Examination:  Filed Vitals:   11/24/13 0838  BP: 130/77  Pulse: 90  Temp: 97.7 F (36.5 C)  Resp: 20  .  Weight: 204 lb 9.6 oz (92.806 kg). No change.  Impression: Tolerating radiation therapy well.  Plan: Continue radiation therapy as planned.

## 2013-11-24 NOTE — Progress Notes (Signed)
Pt denies pain, states Flomax is very helpful. He has not had nocturia x 3 nights. He denies other bladder/bowel issues, loss of appetite. He has experienced fatigue on several days.

## 2013-11-25 ENCOUNTER — Ambulatory Visit
Admission: RE | Admit: 2013-11-25 | Discharge: 2013-11-25 | Disposition: A | Discharge status: Home / Self Care | Payer: Medicare Other | Source: Ambulatory Visit | Attending: Radiation Oncology | Admitting: Radiation Oncology

## 2013-11-26 ENCOUNTER — Ambulatory Visit
Admission: RE | Admit: 2013-11-26 | Discharge: 2013-11-26 | Disposition: A | Discharge status: Home / Self Care | Payer: Medicare Other | Source: Ambulatory Visit | Attending: Radiation Oncology | Admitting: Radiation Oncology

## 2013-11-27 ENCOUNTER — Ambulatory Visit
Admission: RE | Admit: 2013-11-27 | Discharge: 2013-11-27 | Disposition: A | Discharge status: Home / Self Care | Payer: Medicare Other | Source: Ambulatory Visit | Attending: Radiation Oncology | Admitting: Radiation Oncology

## 2013-11-28 ENCOUNTER — Ambulatory Visit
Admission: RE | Admit: 2013-11-28 | Discharge: 2013-11-28 | Disposition: A | Discharge status: Home / Self Care | Payer: Medicare Other | Source: Ambulatory Visit | Attending: Radiation Oncology | Admitting: Radiation Oncology

## 2013-12-01 ENCOUNTER — Ambulatory Visit
Admission: RE | Admit: 2013-12-01 | Discharge: 2013-12-01 | Disposition: A | Discharge status: Home / Self Care | Payer: Medicare Other | Source: Ambulatory Visit | Attending: Radiation Oncology | Admitting: Radiation Oncology

## 2013-12-01 ENCOUNTER — Encounter: Payer: Self-pay | Admitting: Radiation Oncology

## 2013-12-01 VITALS — BP 150/77 | HR 88 | Temp 98.2°F | Resp 20 | Wt 205.7 lb

## 2013-12-01 DIAGNOSIS — C61 Malignant neoplasm of prostate: Secondary | ICD-10-CM

## 2013-12-01 NOTE — Progress Notes (Signed)
Pt denies pain, urinary, bowel issues, loss of appetite. He states he has occasional fatigue. Pt states Flomax still helpful w/urinary frequency, flow. Pt will complete Thurs; gave him 1 month FU card.

## 2013-12-01 NOTE — Progress Notes (Signed)
Weekly Management Note:  Site: Prostate Current Dose:  7215  cGy Projected Dose: 7800  cGy  Narrative: The patient is seen today for routine under treatment assessment. CBCT/MVCT images/port films were reviewed. The chart was reviewed.   Bladder filling is satisfactory. No new GU or GI difficulties. He'll finish his radiation therapy this Thursday.  Physical Examination:  Filed Vitals:   12/01/13 0835  BP: 150/77  Pulse: 88  Temp: 98.2 F (36.8 C)  Resp: 20  .  Weight: 205 lb 11.2 oz (93.305 kg). No change.  Impression: Tolerating radiation therapy well. He'll finish this Thursday. One-month followup visit after completion of radiation therapy.  Plan: Continue radiation therapy as planned.

## 2013-12-02 ENCOUNTER — Ambulatory Visit
Admission: RE | Admit: 2013-12-02 | Discharge: 2013-12-02 | Disposition: A | Discharge status: Home / Self Care | Payer: Medicare Other | Source: Ambulatory Visit | Attending: Radiation Oncology | Admitting: Radiation Oncology

## 2013-12-03 ENCOUNTER — Ambulatory Visit
Admission: RE | Admit: 2013-12-03 | Discharge: 2013-12-03 | Disposition: A | Discharge status: Home / Self Care | Payer: Medicare Other | Source: Ambulatory Visit | Attending: Radiation Oncology | Admitting: Radiation Oncology

## 2013-12-03 ENCOUNTER — Ambulatory Visit: Payer: Medicare Other

## 2013-12-04 ENCOUNTER — Encounter: Payer: Self-pay | Admitting: Radiation Oncology

## 2013-12-04 ENCOUNTER — Ambulatory Visit
Admission: RE | Admit: 2013-12-04 | Discharge: 2013-12-04 | Disposition: A | Discharge status: Home / Self Care | Payer: Medicare Other | Source: Ambulatory Visit | Attending: Radiation Oncology | Admitting: Radiation Oncology

## 2013-12-04 NOTE — Progress Notes (Signed)
[facility] End of Treatment Note  Name:Nicholas Conley  Date: 12/04/2013 ELF:[id number] DOB:1941-06-21   Status:outpatient    CC: Dr. Lowella Bandy  REFERRING PHYSICIAN:  Dr. Lowella Bandy   DIAGNOSIS: Stage TI C. 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 He had some increase in urinary frequency and slowing of his urinary stream which improved with tamsulosin.                         PLAN: Routine followup in one month. Patient instructed to call if questions or worsening complaints in interim.

## 2013-12-12 ENCOUNTER — Ambulatory Visit (HOSPITAL_COMMUNITY)
Admission: RE | Admit: 2013-12-12 | Discharge: 2013-12-12 | Disposition: A | Discharge status: Home / Self Care | Payer: Medicare Other | Source: Ambulatory Visit | Attending: Ophthalmology | Admitting: Ophthalmology

## 2013-12-12 ENCOUNTER — Encounter: Payer: Self-pay | Admitting: Ophthalmology

## 2013-12-12 ENCOUNTER — Other Ambulatory Visit: Payer: Self-pay | Admitting: Ophthalmology

## 2013-12-12 ENCOUNTER — Encounter (HOSPITAL_COMMUNITY)
Admission: RE | Disposition: A | Discharge status: Home / Self Care | Payer: Self-pay | Source: Ambulatory Visit | Attending: Ophthalmology

## 2013-12-12 DIAGNOSIS — H26499 Other secondary cataract, unspecified eye: Secondary | ICD-10-CM | POA: Insufficient documentation

## 2013-12-12 HISTORY — PX: CAPSULOTOMY: SHX5412

## 2013-12-12 SURGERY — MINOR CAPSULOTOMY
Anesthesia: LOCAL | Site: Anus | Laterality: Left

## 2013-12-12 MED ORDER — CYCLOPENTOLATE HCL 1 % OP SOLN
OPHTHALMIC | Status: AC
  Filled 2013-12-12: qty 2

## 2013-12-12 MED ORDER — APRACLONIDINE HCL 1 % OP SOLN: 1.0000 [drp] | Freq: Once | OPHTHALMIC | Status: DC

## 2013-12-12 MED ORDER — APRACLONIDINE HCL 0.5 % OP SOLN
OPHTHALMIC | Status: AC
  Administered 2013-12-12 (×2): 1 [drp]
  Filled 2013-12-12: qty 5

## 2013-12-12 MED ORDER — CYCLOPENTOLATE HCL 1 % OP SOLN
1.0000 [drp] | OPHTHALMIC | Status: AC
  Administered 2013-12-12 (×2): 1 [drp] via OPHTHALMIC

## 2013-12-12 SURGICAL SUPPLY — 28 items
APPLICATOR COTTON TIP 6IN STRL (MISCELLANEOUS) ×2 IMPLANT
BAG MINI COLL DRAIN (WOUND CARE) IMPLANT
BLADE 10 SAFETY STRL DISP (BLADE) ×2 IMPLANT
BLADE KERATOME 2.75 (BLADE) ×2 IMPLANT
BLADE MINI RND TIP GREEN BEAV (BLADE) IMPLANT
CORDS BIPOLAR (ELECTRODE) ×2 IMPLANT
DRAPE OPHTHALMIC 40X48 W POUCH (DRAPES) ×2 IMPLANT
DRAPE RETRACTOR (MISCELLANEOUS) ×2 IMPLANT
GLOVE ECLIPSE 7.0 STRL STRAW (GLOVE) ×2 IMPLANT
GOWN STRL REUS W/ TWL LRG LVL3 (GOWN DISPOSABLE) ×2 IMPLANT
GOWN STRL REUS W/TWL LRG LVL3 (GOWN DISPOSABLE) ×2
KIT BASIN OR (CUSTOM PROCEDURE TRAY) ×2 IMPLANT
KIT ROOM TURNOVER OR (KITS) ×2 IMPLANT
KNIFE CRESCENT 2.5 55 ANG (BLADE) ×2 IMPLANT
MARKER SKIN DUAL TIP RULER LAB (MISCELLANEOUS) IMPLANT
NS IRRIG 1000ML POUR BTL (IV SOLUTION) ×2 IMPLANT
PACK CATARACT CUSTOM (CUSTOM PROCEDURE TRAY) ×2 IMPLANT
PACK CATARACT MCHSCP (PACKS) IMPLANT
PAD ARMBOARD 7.5X6 YLW CONV (MISCELLANEOUS) ×4 IMPLANT
PROBE ANTERIOR 20G W/INFUS NDL (MISCELLANEOUS) IMPLANT
SPEAR EYE SURG WECK-CEL (MISCELLANEOUS) IMPLANT
SUT ETHILON 10 0 CS140 6 (SUTURE) IMPLANT
SUT VICRYL 8 0 TG140 8 (SUTURE) IMPLANT
SYR 3ML LL SCALE MARK (SYRINGE) IMPLANT
TIP SILICONE STR (MISCELLANEOUS)
TIP SILICONE STR 0.3MM UFLOW (MISCELLANEOUS) IMPLANT
TOWEL OR 17X24 6PK STRL BLUE (TOWEL DISPOSABLE) ×4 IMPLANT
WATER STERILE IRR 1000ML POUR (IV SOLUTION) ×2 IMPLANT

## 2013-12-12 NOTE — Discharge Instructions (Signed)
See me in office tomorrow AM for pressure check

## 2013-12-12 NOTE — H&P (Signed)
  73 yo male  had cataract surgery several years ago left eye.  Now has difficulty seeing to read.  Has a cloudy posterior capsule.  Admitted for yag laser capsulotomy os.  H & P submitted to medical records to be scanned into Epic.

## 2013-12-12 NOTE — Progress Notes (Signed)
Nurse called Dr. Ricki Miller and requested that orders be placed in EPIC. Dr. Ricki Miller stated he would do it right now.

## 2013-12-12 NOTE — Brief Op Note (Signed)
12/12/2013  11:04 AM  PATIENT:  Nicholas Conley  73 y.o. male  PRE-OPERATIVE DIAGNOSIS:  OPAQUE POSTERIOR CAPSULE LEFT EYE  POST-OPERATIVE DIAGNOSIS:  * No post-op diagnosis entered *  PROCEDURE:  Procedure(s): YAG LASER CAPSULOTOMY LEFT EYE  (Left)  SURGEON:  Surgeon(s) and Role:    Myrtha Mantis., MD - Primary  PHYSICIAN ASSISTANT:   ASSISTANTS: none   ANESTHESIA:   none  EBL:     BLOOD ADMINISTERED:none  DRAINS: none and Urinary Catheter (Suprapubic)   LOCAL MEDICATIONS USED:  NONE  SPECIMEN:  No Specimen  DISPOSITION OF SPECIMEN:  N/A  COUNTS:  YES  TOURNIQUET:  * No tourniquets in log *  DICTATION: .Other Dictation: Dictation Number [id number]  PLAN OF CARE: Discharge to home after PACU  PATIENT DISPOSITION:  Short Stay   Delay start of Pharmacological VTE agent (>24hrs) due to surgical blood loss or risk of bleeding: not applicable

## 2013-12-12 NOTE — Progress Notes (Signed)
Patient discharged to home. Dr. Ricki Miller instructed patient to follow up in his office on Monday at [facility]. Patient verbalized understanding.

## 2013-12-16 ENCOUNTER — Encounter (HOSPITAL_COMMUNITY): Payer: Self-pay | Admitting: Ophthalmology

## 2013-12-16 NOTE — Op Note (Signed)
[name], [name] NO.:  [id number]  MEDICAL RECORD NO.:  [id number]  LOCATION:  [facility]                         FACILITY:  [facility]  PHYSICIAN:  Garlan Fair., M.D.DATE OF BIRTH:  Mar 25, 1941  DATE OF PROCEDURE:  12/12/2013 DATE OF DISCHARGE:  12/12/2013                              OPERATIVE REPORT   PREOPERATIVE DIAGNOSIS:  Opaque posterior capsule, both eyes.  POSTOPERATIVE DIAGNOSIS:  Opaque posterior capsule, both eyes.  OPERATION:  YAG laser capsulotomy, both eyes.  JUSTIFICATION FOR PROCEDURE:  This is a 73 year old gentleman who had bilateral cataract surgery several years ago.  He can now complain of some blurring of vision with difficulty seeing to read.  He was evaluated and found to have bilateral cloudy posterior capsulea worse on the left than the right.  YAG laser capsulotomy left eye was initially recommended and the patient was admitted for that procedure.  However, the patient during the procedure decided, to prevent him from having to come back later for the other eye, that he preferred to have both posterior capsules treated at this visit.  Since there is strong medical justification for doing both eyes at the same time, the procedure was continued with the idea of using YAG laser on both eyes.  DESCRIPTION OF PROCEDURE:  The patient was brought to the laser room and positioned appropriately behind the YAG laser.  Attention was first given to the left eye.  Thirteen applications of laser energy of 2.6 microjoule were applied to the posterior capsule obtaining a satisfactory opening.  Then after the patient made a request, the attention was turned to the right eye, where 18 applications of laser energy of 2.8 microjoule was applied to the posterior capsule obtaining a satisfactory opening.  The patient tolerated the procedure well and was discharged to the post anesthesia care unit in satisfactory condition.  He was instructed to see  me in office for evaluation of intra-ocular pressure.  DISCHARGE DIAGNOSIS:  Opaque posterior capsule, both eyes.     Garlan Fair., M.D.     TB/MEDQ  D:  12/15/2013  T:  12/16/2013  Job:  [id number]

## 2013-12-26 ENCOUNTER — Encounter: Payer: Self-pay | Admitting: *Deleted

## 2013-12-31 ENCOUNTER — Encounter: Payer: Self-pay | Admitting: Radiation Oncology

## 2013-12-31 ENCOUNTER — Ambulatory Visit
Admission: RE | Admit: 2013-12-31 | Discharge: 2013-12-31 | Disposition: A | Discharge status: Home / Self Care | Payer: Medicare Other | Source: Ambulatory Visit | Attending: Radiation Oncology | Admitting: Radiation Oncology

## 2013-12-31 VITALS — BP 148/72 | HR 92 | Resp 16 | Wt 201.9 lb

## 2013-12-31 DIAGNOSIS — C61 Malignant neoplasm of prostate: Secondary | ICD-10-CM

## 2013-12-31 NOTE — Progress Notes (Signed)
Patient reports that he continues to work full time and by the end of the day feels fatigued. Reports his hemoglobin checked out at 11.1 yesterday and questions if he should add an iron supplement to his daily multivitamin regimen. Reports discomfort when his bladder contracts as he empties. Vitals stable. Denies pain. Reports nocturia x 2-3 dependent upon fluid intake. Denies burning with urination. Reports two weeks ago he saw streaks of blood in his urine that have since resolved. In addition, he reports his bladder discomfort was at its work while seeing these streaks of blood. Reports increased fluid intake mean less bladder discomfort. Scheduled to follow up with Nesi in August after PSA check in July.

## 2013-12-31 NOTE — Progress Notes (Signed)
CC: Dr. 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 He is generally doing well from a GU and GI standpoint although he does have nocturia x2-3 which is almost back to his baseline. He did notice some streaks of blood in his urine which has since resolved. No GI difficulties. He tells me that he had a hemoglobin yesterday which was 11.1. He does not know of any previous hemoglobin value. He does not know the red blood cell indices. He admits to never having colonoscopy. He does not have an active primary care physician. He is scheduled for a followup visit with Dr. Janice Norrie in August with a PSA in July.  Physical examination: Alert and oriented. Filed Vitals:   12/31/13 0904  BP: 148/72  Pulse: 92  Resp: 16   Rectal examination not performed today.  Impression: Satisfactory progress following his radiation therapy. I told him that I was concerned about his hemoglobin of 11.1. I encouraged him to investigate this further, and to also consider colonoscopy. He tells me he will work on this. He is in the process of getting a new primary care physician.  Plan: Followup through Dr. Janice Norrie. Hopefully, he will have had a colonoscopy by the time he sees Dr. Janice Norrie later this summer. I've not scheduled Dr. Tommi Conley for a formal followup visit, and I ask that Dr. Janice Norrie keep me posted on his progress.
